# Patient Record
Sex: Female | Born: 1970 | Race: White | Hispanic: No | Marital: Married | Smoking: Never smoker
Health system: Southern US, Community
[De-identification: ages and names within clinical notes are randomized; demographics above are authoritative.]

---

## 2011-12-09 DIAGNOSIS — N870 Mild cervical dysplasia: Secondary | ICD-10-CM | POA: Insufficient documentation

## 2013-09-10 DIAGNOSIS — J381 Polyp of vocal cord and larynx: Secondary | ICD-10-CM | POA: Insufficient documentation

## 2013-09-10 DIAGNOSIS — I10 Essential (primary) hypertension: Secondary | ICD-10-CM | POA: Insufficient documentation

## 2013-09-10 DIAGNOSIS — R490 Dysphonia: Secondary | ICD-10-CM | POA: Insufficient documentation

## 2013-10-04 DIAGNOSIS — J381 Polyp of vocal cord and larynx: Secondary | ICD-10-CM | POA: Insufficient documentation

## 2013-12-21 DIAGNOSIS — K591 Functional diarrhea: Secondary | ICD-10-CM | POA: Insufficient documentation

## 2014-07-16 ENCOUNTER — Encounter: Payer: Self-pay | Admitting: Podiatry

## 2014-07-16 ENCOUNTER — Ambulatory Visit (INDEPENDENT_AMBULATORY_CARE_PROVIDER_SITE_OTHER): Payer: 59 | Admitting: Podiatry

## 2014-07-16 ENCOUNTER — Ambulatory Visit (INDEPENDENT_AMBULATORY_CARE_PROVIDER_SITE_OTHER): Payer: 59

## 2014-07-16 VITALS — Ht 69.0 in | Wt 185.0 lb

## 2014-07-16 DIAGNOSIS — M722 Plantar fascial fibromatosis: Secondary | ICD-10-CM

## 2014-07-16 MED ORDER — METHYLPREDNISOLONE 4 MG PO TBPK: ORAL_TABLET | ORAL | Status: DC

## 2014-07-16 MED ORDER — MELOXICAM 15 MG PO TABS: 15.0000 mg | ORAL_TABLET | Freq: Every day | ORAL | Status: DC

## 2014-07-16 NOTE — Progress Notes (Signed)
   Subjective:    Patient ID: Lauren PoloKatina Ringstad, female    DOB: Mar 26, 1970, 44 y.o.   MRN: [id number] Pt comes in today with right foot pain day and night, worse after sitting all periods of time and first thing in the am. Treatment: Pt has had cortzone pills 8-10 months but did not last.  Duration : this has been bothering her for about a year. Worse for about 3-4 months.   HPI    Review of Systems  All other systems reviewed and are negative.      Objective:   Physical Exam: I have reviewed her past mental history medications allergy surgery social history and review of systems. Pulses are strongly palpable bilateral. Neurologic sensorium is intact per Semmes-Weinstein monofilament. Deep tendon reflexes are intact bilateral and muscle strength +5 over 5 dorsiflexion plantar flexors and inverters and everters all intrinsic musculature is intact. Orthopedic evaluation demonstrates all joints distal to the ankle for range of motion I crepitation. He has pain on palpation medial calcaneal tubercle of the right heel. No pain on medial and lateral compression of the calcaneus. Radiographic evaluation confirms a plantar distally oriented calcaneal heel spur with a soft tissue increase in density at the plantar fascial calcaneal insertion site.        Assessment & Plan:  Assessment: Plantar fasciitis right heel.  Plan: Injected her right heel today with Kenalog and local anesthetic. Started her on a Medrol Dosepak to be followed by meloxicam. Dispensed the plantar fascial brace as well as a plantar fascial night splint. Discussed appropriate shoe gear stretching exercises ice therapy and sugar modifications also discussed the etiology pathology conservative versus surgical therapies. I will follow-up with her in 1 month. Should she have questions or concerns she will notify us immediately.

## 2014-08-11 ENCOUNTER — Encounter: Payer: Self-pay | Admitting: Podiatry

## 2014-08-11 ENCOUNTER — Ambulatory Visit (INDEPENDENT_AMBULATORY_CARE_PROVIDER_SITE_OTHER): Payer: 59 | Admitting: Podiatry

## 2014-08-11 VITALS — BP 116/64 | HR 74 | Resp 16

## 2014-08-11 DIAGNOSIS — M722 Plantar fascial fibromatosis: Secondary | ICD-10-CM

## 2014-08-11 NOTE — Progress Notes (Signed)
She presents today for follow-up of her plantar fasciitis right heel. She states that is approximately 75% improved. She continues conservative therapies.  Objective: Vital signs are stable she's alert and oriented 3. Pulses are strongly palpable. She is pain on palpation me for definitive of the right heel.  Assessment: Fasciitis 75% resolved  Plan: Reinjected the right heel today continue all other conservative therapies follow up with me in 1 month

## 2014-09-15 ENCOUNTER — Ambulatory Visit: Payer: Commercial Managed Care - PPO | Admitting: Podiatry

## 2014-12-10 ENCOUNTER — Ambulatory Visit (INDEPENDENT_AMBULATORY_CARE_PROVIDER_SITE_OTHER): Payer: 59 | Admitting: Podiatry

## 2014-12-10 ENCOUNTER — Encounter: Payer: Self-pay | Admitting: Podiatry

## 2014-12-10 VITALS — BP 117/75 | HR 83 | Resp 18

## 2014-12-10 DIAGNOSIS — M722 Plantar fascial fibromatosis: Secondary | ICD-10-CM

## 2014-12-10 MED ORDER — METHYLPREDNISOLONE 4 MG PO TBPK: ORAL_TABLET | ORAL | Status: DC

## 2014-12-10 NOTE — Progress Notes (Signed)
She presents today for follow-up of plantar fasciitis of her right foot. She states that she wears a brace which makes it feel better however she doesn't feel that the foot itself is getting any better. She states she is unable to wear her night splint and she was not able to take her medications because of it elevating her blood pressure.  Objective: Vital signs are stable she is alert and oriented 3. Pulses are palpable. She is pain on palpation medial calcaneal tubercle of the right heel.  Assessment: Plantar fasciitis right.  Plan: Reinjected the right heel today had her scan for some orthotics. Follow up with her once the cement.

## 2014-12-31 ENCOUNTER — Encounter: Payer: Self-pay | Admitting: Podiatry

## 2014-12-31 ENCOUNTER — Ambulatory Visit (INDEPENDENT_AMBULATORY_CARE_PROVIDER_SITE_OTHER): Payer: 59 | Admitting: Podiatry

## 2014-12-31 DIAGNOSIS — M722 Plantar fascial fibromatosis: Secondary | ICD-10-CM

## 2014-12-31 NOTE — Progress Notes (Signed)
She presents today to pick up her orthotics. She was given both oral and written home-going instructions for care and use of the orthotics she will follow-up with us in 4-6 weeks.

## 2014-12-31 NOTE — Patient Instructions (Signed)

## 2015-01-26 ENCOUNTER — Ambulatory Visit: Payer: 59 | Admitting: Podiatry

## 2015-02-02 ENCOUNTER — Ambulatory Visit: Payer: 59 | Admitting: Podiatry

## 2015-02-02 ENCOUNTER — Ambulatory Visit (INDEPENDENT_AMBULATORY_CARE_PROVIDER_SITE_OTHER): Payer: 59 | Admitting: Podiatry

## 2015-02-02 ENCOUNTER — Encounter: Payer: Self-pay | Admitting: Podiatry

## 2015-02-02 VITALS — BP 113/70 | HR 68 | Resp 12

## 2015-02-02 DIAGNOSIS — M722 Plantar fascial fibromatosis: Secondary | ICD-10-CM | POA: Diagnosis not present

## 2015-02-02 NOTE — Patient Instructions (Signed)
Pre-Operative Instructions  Congratulations, you have decided to take an important step to improving your quality of life.  You can be assured that the doctors of [facility] will be with you every step of the way.  1. Plan to be at the surgery center/hospital at least 1 (one) hour prior to your scheduled time unless otherwise directed by the surgical center/hospital staff.  You must have a responsible adult accompany you, remain during the surgery and drive you home.  Make sure you have directions to the surgical center/hospital and know how to get there on time. 2. For hospital based surgery you will need to obtain a history and physical form from your family physician within 1 month prior to the date of surgery- we will give you a form for you primary physician.  3. We make every effort to accommodate the date you request for surgery.  There are however, times where surgery dates or times have to be moved.  We will contact you as soon as possible if a change in schedule is required.   4. No Aspirin/Ibuprofen for one week before surgery.  If you are on aspirin, any non-steroidal anti-inflammatory medications (Mobic, Aleve, Ibuprofen) you should stop taking it 7 days prior to your surgery.  You make take Tylenol  For pain prior to surgery.  5. Medications- If you are taking daily heart and blood pressure medications, seizure, reflux, allergy, asthma, anxiety, pain or diabetes medications, make sure the surgery center/hospital is aware before the day of surgery so they may notify you which medications to take or avoid the day of surgery. 6. No food or drink after midnight the night before surgery unless directed otherwise by surgical center/hospital staff. 7. No alcoholic beverages 24 hours prior to surgery.  No smoking 24 hours prior to or 24 hours after surgery. 8. Wear loose pants or shorts- loose enough to fit over bandages, boots, and casts. 9. No slip on shoes, sneakers are best. 10. Bring  your boot with you to the surgery center/hospital.  Also bring crutches or a walker if your physician has prescribed it for you.  If you do not have this equipment, it will be provided for you after surgery. 11. If you have not been contracted by the surgery center/hospital by the day before your surgery, call to confirm the date and time of your surgery. 12. Leave-time from work may vary depending on the type of surgery you have.  Appropriate arrangements should be made prior to surgery with your employer. 13. Prescriptions will be provided immediately following surgery by your doctor.  Have these filled as soon as possible after surgery and take the medication as directed. 14. Remove nail polish on the operative foot. 15. Wash the night before surgery.  The night before surgery wash the foot and leg well with the antibacterial soap provided and water paying special attention to beneath the toenails and in between the toes.  Rinse thoroughly with water and dry well with a towel.  Perform this wash unless told not to do so by your physician.  Enclosed: 1 Ice pack (please put in freezer the night before surgery)   1 Hibiclens skin cleaner   Pre-op Instructions  If you have any questions regarding the instructions, do not hesitate to call our office.  [place]: [address] [place], [place] [address] [phone]  [place]: [address] [place], [place] [address] [phone]  [place]: [address]  [place], [place] [address] [phone]  Dr. Richard   Tuchman DPM, Dr. Norman Regal DPM Dr. Richard Sikora DPM, Dr. M. Todd Hyatt DPM, Dr. Kathryn Egerton DPM 

## 2015-02-02 NOTE — Progress Notes (Signed)
She presents today for follow-up of her plantar fasciitis right foot. She states that after the multiple injections, the orthotics, anti-inflammatories steroidal and nonsteroidal, braces and night splints with shoe gear changes my right foot is unchanged and it is still very painful limiting my daily activities.  Objective: Vital signs are stable she is alert and oriented 3. Pulses are strongly palpable. She has moderate to severe pain on palpation medial calcaneal tubercle of her right foot.  Assessment: Chronic intractable plantar fasciitis right.  Plan: Discussed etiology pathology conservative versus surgical therapy. We consented her today for an endoscopic plantar fasciotomy. We went over the consent form today line by line number by number off and her time to ask questions she saw fit regarding this procedure. I answered them to the best of my ability and layman's terms. We discussed the possible postop complications which may include but are not limited to postoperative pain bleeding swelling infection and recurrence need for further surgery loss of digit loss of limb loss of life. She signed all 3 pages of the consent form was dispensed a cam walker and I will follow-up with her before the end of the year for surgery.

## 2015-05-26 ENCOUNTER — Telehealth: Payer: Self-pay | Admitting: *Deleted

## 2015-05-26 NOTE — Telephone Encounter (Signed)
I'm calling to see if you would like to schedule surgery that Dr. Al CorpusHyatt mentioned to you.  "Yes, I've been waiting on someone to call me.  Can you give me some dates and I'll call you back?"  He can do it on March 24 or 31, April 7,21 or 28.  "Okay thank you, I'll call you back."

## 2016-07-18 ENCOUNTER — Ambulatory Visit: Payer: 59 | Admitting: Podiatry

## 2016-07-25 ENCOUNTER — Encounter: Payer: Self-pay | Admitting: Podiatry

## 2016-07-25 ENCOUNTER — Ambulatory Visit (INDEPENDENT_AMBULATORY_CARE_PROVIDER_SITE_OTHER): Payer: 59 | Admitting: Podiatry

## 2016-07-25 ENCOUNTER — Ambulatory Visit (INDEPENDENT_AMBULATORY_CARE_PROVIDER_SITE_OTHER): Payer: 59

## 2016-07-25 DIAGNOSIS — M79672 Pain in left foot: Secondary | ICD-10-CM | POA: Diagnosis not present

## 2016-07-25 DIAGNOSIS — M722 Plantar fascial fibromatosis: Secondary | ICD-10-CM

## 2016-07-25 DIAGNOSIS — M779 Enthesopathy, unspecified: Secondary | ICD-10-CM | POA: Diagnosis not present

## 2016-07-25 MED ORDER — MELOXICAM 15 MG PO TABS: 15.0000 mg | ORAL_TABLET | Freq: Every day | ORAL | 3 refills | Status: DC

## 2016-07-25 MED ORDER — METHYLPREDNISOLONE 4 MG PO TBPK: ORAL_TABLET | ORAL | 0 refills | Status: DC

## 2016-07-25 NOTE — Progress Notes (Signed)
She presents today chief complaint of pain to the medial aspect of her foot as the inside of this foot has been hurting now for the past few months she states been swelling and painful just a history of plantar fasciitis in 2016.  Objective: Vital signs are stable alert and oriented 3. Pulses are palpable. She is pain on palpation mucogingival of the left heel as well as some edema overlying the posterior tibial tendon which does appear to be fluctuant on palpation.  Assessment: Plantar fascitis left. Posterior tibial tendinitis left.  Plan: Injected the fashion today with Kenalog and local anesthetic injected the area of fluctuance of 6 months of local anesthetic started on a Medrol Dosepak to be followed by meloxicam. Posterior plantar fascia brace. She will use her night splint at home we discussed appropriate shoe gear stretching exercises and ice therapy. Follow up with her in 1 month.

## 2016-09-19 ENCOUNTER — Ambulatory Visit (INDEPENDENT_AMBULATORY_CARE_PROVIDER_SITE_OTHER): Payer: BLUE CROSS/BLUE SHIELD | Admitting: Podiatry

## 2016-09-19 ENCOUNTER — Encounter: Payer: Self-pay | Admitting: Podiatry

## 2016-09-19 DIAGNOSIS — M722 Plantar fascial fibromatosis: Secondary | ICD-10-CM

## 2016-09-19 DIAGNOSIS — M76822 Posterior tibial tendinitis, left leg: Secondary | ICD-10-CM

## 2016-09-19 NOTE — Progress Notes (Signed)
She presents today for follow-up of her plantar fasciitis and posterior tibial tendinitis of her left foot. She states that really hasn't gone away the shot helped some but it came right back.  Objective: Vital signs are stable she is alert and oriented 3. Pulses are palpable. She has severe pain on palpation medial calcaneal tubercle of the left heel as well as the insertion site of the posterior tibial tendon on the navicular tuberosity. She has fluctuance within the tendon sheath as well. This is consistent with tenosynovitis or tendinitis. Cannot rule out tear secondary to the chronic pain.  Assessment: Pain in limb secondary plantar fasciitis and tendinitis.  Plan: MRI requesting for surgical intervention.

## 2016-09-22 ENCOUNTER — Telehealth: Payer: Self-pay

## 2016-09-22 NOTE — Addendum Note (Signed)
Addended by: Geraldine ContrasVENABLE, Hernando Reali D on: 09/22/2016 11:30 AM   Modules accepted: Orders

## 2016-09-22 NOTE — Telephone Encounter (Signed)
Spoke with Aram Beechamynthia with [employer], per Aram Beechamynthia no prior authorization is required for MRI rt heel.   Order has been sent to MRI schedulers.

## 2016-10-11 ENCOUNTER — Encounter: Payer: Self-pay | Admitting: Radiology

## 2016-10-11 ENCOUNTER — Ambulatory Visit
Admission: RE | Admit: 2016-10-11 | Discharge: 2016-10-11 | Disposition: A | Discharge status: Home / Self Care | Payer: BLUE CROSS/BLUE SHIELD | Source: Ambulatory Visit | Attending: Podiatry | Admitting: Podiatry

## 2016-10-11 DIAGNOSIS — M25472 Effusion, left ankle: Secondary | ICD-10-CM | POA: Insufficient documentation

## 2016-10-11 DIAGNOSIS — X58XXXA Exposure to other specified factors, initial encounter: Secondary | ICD-10-CM | POA: Diagnosis not present

## 2016-10-11 DIAGNOSIS — M722 Plantar fascial fibromatosis: Secondary | ICD-10-CM | POA: Diagnosis present

## 2016-10-11 DIAGNOSIS — M76822 Posterior tibial tendinitis, left leg: Secondary | ICD-10-CM | POA: Diagnosis present

## 2016-10-11 DIAGNOSIS — S86012A Strain of left Achilles tendon, initial encounter: Secondary | ICD-10-CM | POA: Insufficient documentation

## 2016-10-12 ENCOUNTER — Ambulatory Visit (INDEPENDENT_AMBULATORY_CARE_PROVIDER_SITE_OTHER): Payer: BLUE CROSS/BLUE SHIELD | Admitting: Podiatry

## 2016-10-12 ENCOUNTER — Encounter: Payer: Self-pay | Admitting: Podiatry

## 2016-10-12 DIAGNOSIS — M722 Plantar fascial fibromatosis: Secondary | ICD-10-CM

## 2016-10-12 NOTE — Progress Notes (Signed)
She presents today for follow-up of her MRI and severe plantar fasciitis of her left foot.  Objective: Vital signs are stable she is alert and oriented 3. Pulses are palpable. Neurologic sensorium is intact. She has severe pain on palpation medially continued on the left heel and MRI does demonstrate severe plantar fasciitis and edema in the bone.  Assessment: Severe plantar fasciitis left foot.  Plan: Consented her today for an instep plantar fasciotomy and injected the left heel. We discussed pros and cons of the fasciotomy she understands and is amenable to a signed of revision sent for she will be out of work for at least 2 weeks. We dispensed a cam walker as well as paperwork for the surgery center and anesthesia group. She also received preop paperwork from us as well.

## 2016-10-12 NOTE — Patient Instructions (Signed)
Pre-Operative Instructions  Congratulations, you have decided to take an important step to improving your quality of life.  You can be assured that the doctors of [facility] will be with you every step of the way.  1. Plan to be at the surgery center/hospital at least 1 (one) hour prior to your scheduled time unless otherwise directed by the surgical center/hospital staff.  You must have a responsible adult accompany you, remain during the surgery and drive you home.  Make sure you have directions to the surgical center/hospital and know how to get there on time. 2. For hospital based surgery you will need to obtain a history and physical form from your family physician within 1 month prior to the date of surgery- we will give you a form for you primary physician.  3. We make every effort to accommodate the date you request for surgery.  There are however, times where surgery dates or times have to be moved.  We will contact you as soon as possible if a change in schedule is required.   4. No Aspirin/Ibuprofen for one week before surgery.  If you are on aspirin, any non-steroidal anti-inflammatory medications (Mobic, Aleve, Ibuprofen) you should stop taking it 7 days prior to your surgery.  You make take Tylenol  For pain prior to surgery.  5. Medications- If you are taking daily heart and blood pressure medications, seizure, reflux, allergy, asthma, anxiety, pain or diabetes medications, make sure the surgery center/hospital is aware before the day of surgery so they may notify you which medications to take or avoid the day of surgery. 6. No food or drink after midnight the night before surgery unless directed otherwise by surgical center/hospital staff. 7. No alcoholic beverages 24 hours prior to surgery.  No smoking 24 hours prior to or 24 hours after surgery. 8. Wear loose pants or shorts- loose enough to fit over bandages, boots, and casts. 9. No slip on shoes, sneakers are best. 10. Bring  your boot with you to the surgery center/hospital.  Also bring crutches or a walker if your physician has prescribed it for you.  If you do not have this equipment, it will be provided for you after surgery. 11. If you have not been contracted by the surgery center/hospital by the day before your surgery, call to confirm the date and time of your surgery. 12. Leave-time from work may vary depending on the type of surgery you have.  Appropriate arrangements should be made prior to surgery with your employer. 13. Prescriptions will be provided immediately following surgery by your doctor.  Have these filled as soon as possible after surgery and take the medication as directed. 14. Remove nail polish on the operative foot. 15. Wash the night before surgery.  The night before surgery wash the foot and leg well with the antibacterial soap provided and water paying special attention to beneath the toenails and in between the toes.  Rinse thoroughly with water and dry well with a towel.  Perform this wash unless told not to do so by your physician.  Enclosed: 1 Ice pack (please put in freezer the night before surgery)   1 Hibiclens skin cleaner   Pre-op Instructions  If you have any questions regarding the instructions, do not hesitate to call our office.  [place]: [address] [place], [place] [address] [phone]  [place]: [address] [place], [place] [address] [phone]  [place]: [address]  [place], [place] [address] [phone]   Dr.   Norman Regal DPM, Dr. Matthew Wagoner DPM, Dr. M. Todd Hyatt DPM, Dr. Titorya Stover DPM 

## 2016-10-13 ENCOUNTER — Telehealth: Payer: Self-pay | Admitting: *Deleted

## 2016-10-13 NOTE — Telephone Encounter (Signed)
"  I'm a patient of Dr. Al CorpusHyatt.  He told me to call you to schedule my surgery."  Do you have a date in mind?  "I'd like to do it sooner but I can't.  Does he have anything on September 14?"  Yes, that date is available.  I will get it scheduled.   You can register now with the surgical center, instructions are in you brochure that was given to you.  "What times are available?"  Someone from the surgical center will call you with the arrival time a day or two prior to surgery date.

## 2016-10-18 DIAGNOSIS — M722 Plantar fascial fibromatosis: Secondary | ICD-10-CM

## 2016-11-09 ENCOUNTER — Ambulatory Visit: Payer: BLUE CROSS/BLUE SHIELD | Admitting: Podiatry

## 2016-11-17 ENCOUNTER — Telehealth: Payer: Self-pay | Admitting: Podiatry

## 2016-11-17 NOTE — Telephone Encounter (Signed)
Patient is having surgery Friday Sept 14 and she has requested an order for a knee scooter. Would like to have the knee scooter at home when she gets home so that she is not weight bearing.  Patient would [facility] to [facility] the [facility] asap.

## 2016-11-17 NOTE — Telephone Encounter (Signed)
I returned patient call and informed her that she order from [facility] for cheaper or pick up rx.  She requested an rx to file with insurance.   She will come to office to pick up rx, which has been left at front desk

## 2016-11-17 NOTE — Telephone Encounter (Signed)
Please write a script for knee scooter.  Tell her it would be less expensive if she ordered it from [facility]

## 2016-11-29 ENCOUNTER — Telehealth: Payer: Self-pay | Admitting: *Deleted

## 2016-11-29 NOTE — Telephone Encounter (Signed)
"  If you could please give me a call back in regards to rescheduling a surgery coming up this Friday.  Thank you."

## 2016-11-30 NOTE — Telephone Encounter (Signed)
"  I'm calling to reschedule my surgery that I had scheduled for this Friday."  Do you have a date in mind that you want?  "Does he have anything next Friday?"  Yes, he can do it next Friday.  I'll get it scheduled.  "What time will I need to be there?"  Someone from the surgical center will call you with the arrival time a day or two prior to surgery date.  "They sent an email before."  They may do it this time as well.  I called Aram [facility]ynthia at [facility] and scheduled the surgery for 12/09/2016.

## 2016-12-07 ENCOUNTER — Encounter: Payer: BLUE CROSS/BLUE SHIELD | Admitting: Podiatry

## 2016-12-08 ENCOUNTER — Other Ambulatory Visit: Payer: Self-pay | Admitting: Podiatry

## 2016-12-08 MED ORDER — PROMETHAZINE HCL 25 MG PO TABS: 25.0000 mg | ORAL_TABLET | Freq: Three times a day (TID) | ORAL | 0 refills | Status: DC | PRN

## 2016-12-08 MED ORDER — CLINDAMYCIN HCL 150 MG PO CAPS: 150.0000 mg | ORAL_CAPSULE | Freq: Three times a day (TID) | ORAL | 0 refills | Status: DC

## 2016-12-08 MED ORDER — OXYCODONE-ACETAMINOPHEN 10-325 MG PO TABS: 1.0000 | ORAL_TABLET | ORAL | 0 refills | Status: DC | PRN

## 2016-12-09 ENCOUNTER — Encounter: Payer: Self-pay | Admitting: Podiatry

## 2016-12-09 DIAGNOSIS — M722 Plantar fascial fibromatosis: Secondary | ICD-10-CM | POA: Diagnosis not present

## 2016-12-16 ENCOUNTER — Ambulatory Visit (INDEPENDENT_AMBULATORY_CARE_PROVIDER_SITE_OTHER): Payer: BLUE CROSS/BLUE SHIELD | Admitting: Podiatry

## 2016-12-16 ENCOUNTER — Encounter: Payer: BLUE CROSS/BLUE SHIELD | Admitting: Podiatry

## 2016-12-16 DIAGNOSIS — Z9889 Other specified postprocedural states: Secondary | ICD-10-CM

## 2016-12-16 NOTE — Progress Notes (Signed)
   Subjective:  Patient presents today status post endoscopic plantar fasciotomy left foot. DOS: 12/09/2016. Patient states that she's doing very well. Patient has no new complaints at this time. There is some white discoloration on the plantar aspect of her foot from maceration of the skin.   No past medical history on file.    Objective/Physical Exam Skin incisions appear to be well coapted with sutures and staples intact. No sign of infectious process noted. No dehiscence. No active bleeding noted. Moderate edema noted to the surgical extremity. Neurovascular status intact There is some maceration noted the plantar aspect of the forefoot. Negative for skin breakdown.  Radiographic Exam:  Orthopedic hardware and osteotomies sites appear to be stable with routine healing.  Assessment: 1. s/p endoscopic plantar fasciotomy left. DOS: 12/09/2016   Plan of Care:  1. Patient was evaluated.  2. Today dressings were changed. Recommend the patient apply antibiotic ointment and Band-Aid daily. 3. Compression anklet was dispensed 4. Return to clinic in 1 week for possible suture removal   Felecia Shelling, DPM [facility]  Dr. Felecia Shelling, DPM    [address]                                        [place], [place] [address]                Office [phone]  Fax [phone]

## 2016-12-21 ENCOUNTER — Encounter: Payer: BLUE CROSS/BLUE SHIELD | Admitting: Podiatry

## 2016-12-26 ENCOUNTER — Encounter: Payer: Self-pay | Admitting: Podiatry

## 2016-12-26 ENCOUNTER — Ambulatory Visit: Payer: BLUE CROSS/BLUE SHIELD | Admitting: Podiatry

## 2016-12-26 ENCOUNTER — Ambulatory Visit (INDEPENDENT_AMBULATORY_CARE_PROVIDER_SITE_OTHER): Payer: BLUE CROSS/BLUE SHIELD | Admitting: Podiatry

## 2016-12-26 DIAGNOSIS — M722 Plantar fascial fibromatosis: Secondary | ICD-10-CM

## 2016-12-27 NOTE — Progress Notes (Signed)
DOS 12/09/16 EPF Lt

## 2016-12-27 NOTE — Progress Notes (Signed)
She presents today 2 weeks status post endoscopic plantar fasciotomy left foot. She states that it feels like it's 100% better now ready to go She denies fever chills nausea vomiting muscle aches or pains. States that she is ready to get out of her boot.  Objective: Vital signs are stable she is alert and oriented 3 sutures were removed today margins remain well coapted no erythema cellulitis drainage or odor. No pain on palpation medial calcaneal tubercle.  Assessment: Well-healing surgical foot left.  Plan: I recommended today that she stay off his foot as much as she can I will allow her to start wearing tennis shoes but she still needs to treated as a surgical foot. I told her not to be over anxious about getting back to her exercising. She is to continue to wear either her large boot or her night splint at bedtime. We dispensed night splint.

## 2017-01-23 ENCOUNTER — Ambulatory Visit (INDEPENDENT_AMBULATORY_CARE_PROVIDER_SITE_OTHER): Payer: BLUE CROSS/BLUE SHIELD | Admitting: Podiatry

## 2017-01-23 ENCOUNTER — Encounter: Payer: Self-pay | Admitting: Podiatry

## 2017-01-23 DIAGNOSIS — Z9889 Other specified postprocedural states: Secondary | ICD-10-CM

## 2017-01-23 DIAGNOSIS — M722 Plantar fascial fibromatosis: Secondary | ICD-10-CM

## 2017-01-23 NOTE — Progress Notes (Signed)
She is status post EPF left foot date of surgery 12/09/2016. She states that is doing just great. Has no pain.  Objective: No pain on palpation medially into the left foot. No erythema edema cellulitis drainage or odor. Wounds are gone to heal uneventfully.  Assessment: 90-100% resolved pain on her fascial left.  Plan: Follow-up with a 1 month if necessary

## 2017-02-20 ENCOUNTER — Ambulatory Visit (INDEPENDENT_AMBULATORY_CARE_PROVIDER_SITE_OTHER): Payer: BLUE CROSS/BLUE SHIELD | Admitting: Podiatry

## 2017-02-20 ENCOUNTER — Encounter: Payer: Self-pay | Admitting: Podiatry

## 2017-02-20 DIAGNOSIS — M722 Plantar fascial fibromatosis: Secondary | ICD-10-CM

## 2017-02-20 NOTE — Progress Notes (Signed)
She presents today for follow-up of her endoscopic plantar fasciotomy left foot. She states that she still has some soreness but it is much better than it was. She states that she has no plantar fascial pain at all.  Objective: Vital signs are stable alert and oriented 3. Pulses are palpable. There is no erythema edema cellulitis drainage or odor. She still has some scar tissue that is palpable to the plantar aspect of the foot for the trocar was introduced. This should resolve.  Assessment: Endoscopic plantar fasciotomy healing well.  Plan: Follow up with me on an as-needed basis encouraged massage therapy to help break down scar tissue.

## 2019-04-30 IMAGING — MR MR HEEL *L* W/O CM
5 series · 40 of 40 positions shown · non-contrast
Comparison: None.

CLINICAL DATA: Plantar fasciitis for 2 years. Left heel pain. Not
responding to treatment.

EXAM:
MR OF THE LEFT HEEL WITHOUT CONTRAST
TECHNIQUE: Multiplanar, multisequence MR imaging of the ankle was performed. No
intravenous contrast was administered.

[Series 3: PD fat-sat · axial · 3.0mm · 0.66mm/px · z∈[-39,+83]mm · 8 of 38 slices shown]
[im 1/38]
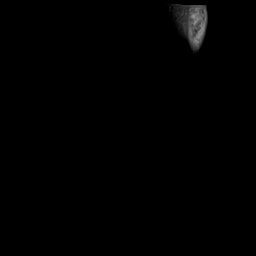
[im 6/38]
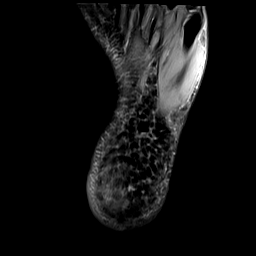
[im 11/38]
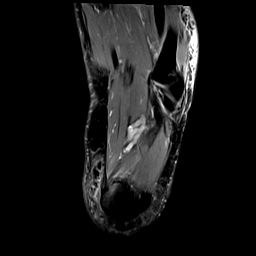
[im 16/38]
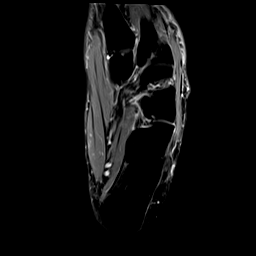
[im 22/38]
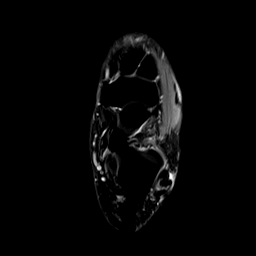
[im 27/38]
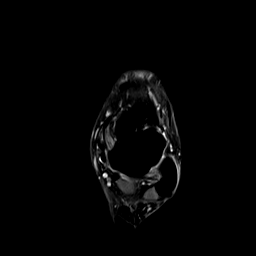
[im 32/38]
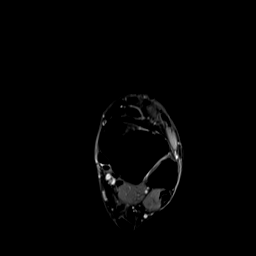
[im 38/38]
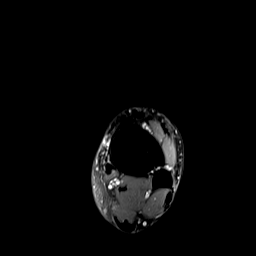

[Series 4: T2 fat-sat · axial · 3.0mm · 0.66mm/px · z∈[-39,+83]mm · 9 of 38 slices shown (1 of 3)]
[im 1/38]
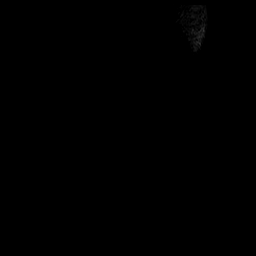
[im 5/38]
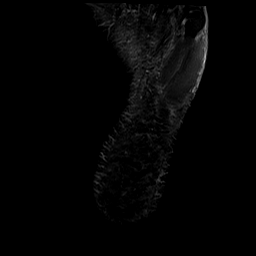
[im 10/38]
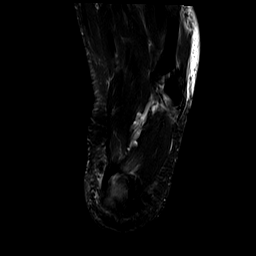
[im 14/38]
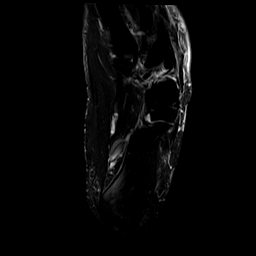
[im 19/38]
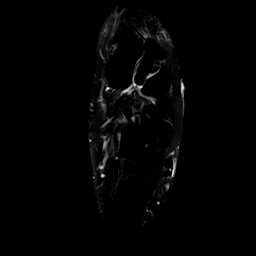
[im 24/38]
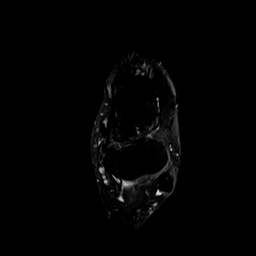
[im 28/38]
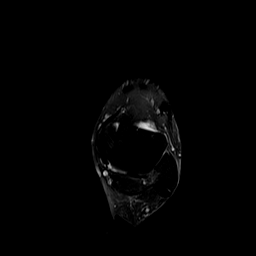
[im 33/38]
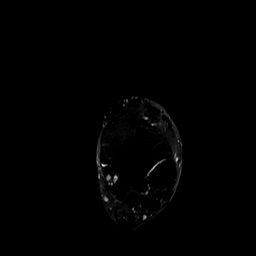
[im 38/38]
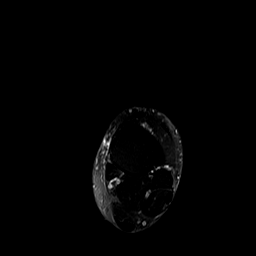

[Series 5: T2 fat-sat · coronal · 3.0mm · 0.70mm/px · 11 of 49 slices shown (2 of 3)]
[im 1/49]
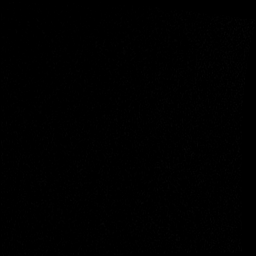
[im 5/49]
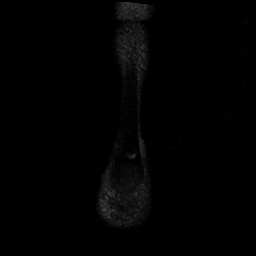
[im 10/49]
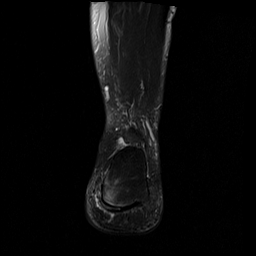
[im 15/49]
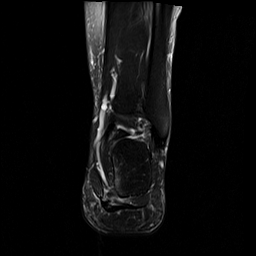
[im 20/49]
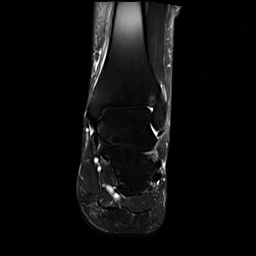
[im 25/49]
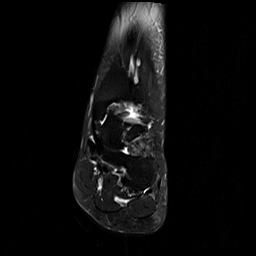
[im 29/49]
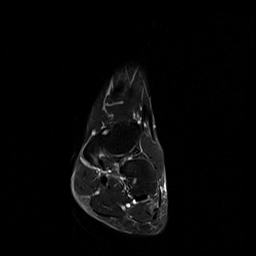
[im 34/49]
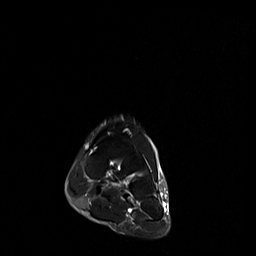
[im 39/49]
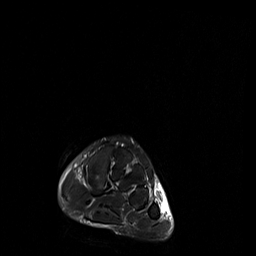
[im 44/49]
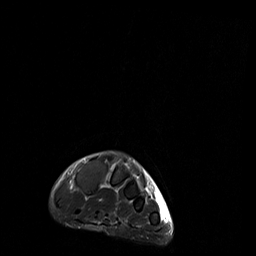
[im 49/49]
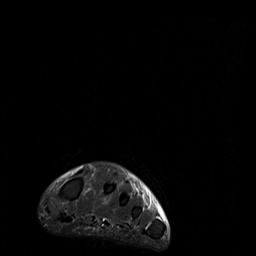

[Series 6: T1 · sagittal · 3.0mm · 0.56mm/px · 6 of 26 slices shown]
[im 1/26]
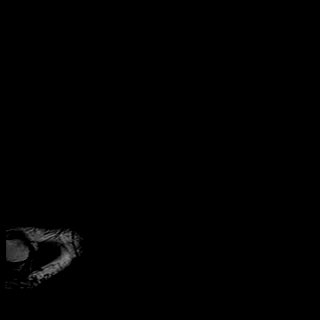
[im 6/26]
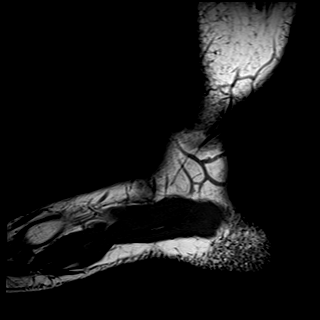
[im 11/26]
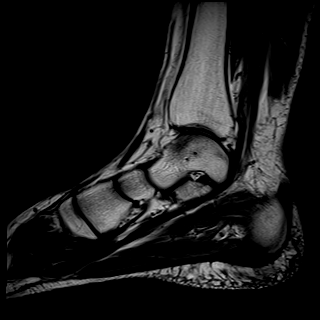
[im 16/26]
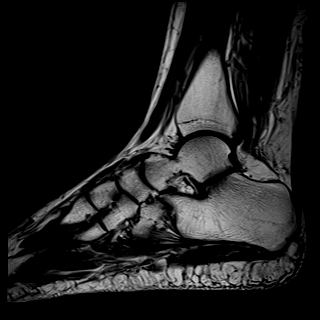
[im 21/26]
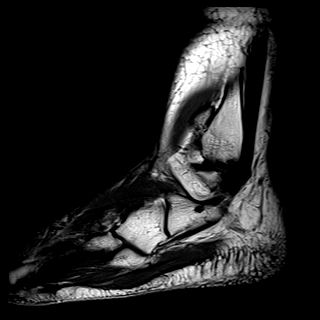
[im 26/26]
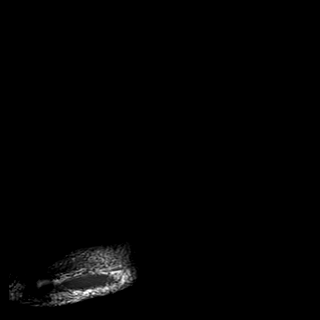

[Series 7: T2 fat-sat · sagittal · 3.0mm · 0.70mm/px · 6 of 26 slices shown (3 of 3)]
[im 1/26]
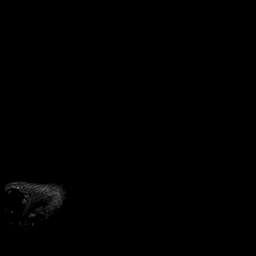
[im 6/26]
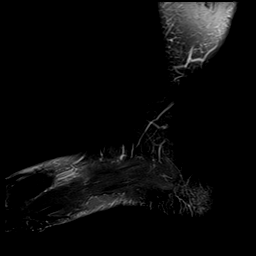
[im 11/26]
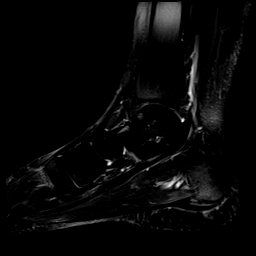
[im 16/26]
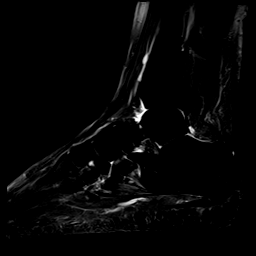
[im 21/26]
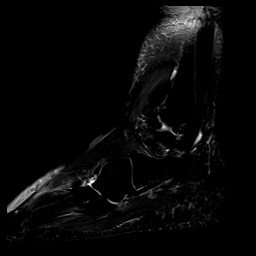
[im 26/26]
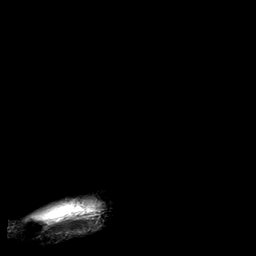

[40 of 40 positions shown; findings below may reference images not displayed]

FINDINGS: TENDONS

Peroneal: Peroneal longus tendon intact. Peroneal brevis intact.

Posteromedial: Posterior tibial tendon intact. Flexor hallucis
longus tendon intact. Flexor digitorum longus tendon intact.

Anterior: Tibialis anterior tendon intact. Extensor hallucis longus
tendon intact Extensor digitorum longus tendon intact.

Achilles:  Intact.

Plantar Fascia: Thickening of the medial band of the plantar fascia
adjacent to the calcaneal insertion with mild perifascial edema and
a partial-thickness tear at the calcaneal insertion. Severe
subcortical reactive marrow edema at the calcaneal insertion.

LIGAMENTS

Lateral: Anterior talofibular ligament intact. Calcaneofibular
ligament intact. Posterior talofibular ligament intact. Anterior and
posterior tibiofibular ligaments intact.

Medial: Deltoid ligament intact. Spring ligament intact.

CARTILAGE

Ankle Joint: No joint effusion. Normal ankle mortise. No chondral
defect.

Subtalar Joints/Sinus Tarsi: Normal subtalar joints. No subtalar
joint effusion. Normal sinus tarsi.

Bones: No acute fracture or dislocation. Mild reactive marrow edema
in the medial and lateral hallux sesamoids and adjacent metatarsal
head consistent with degenerative changes.

Soft Tissue: No fluid collection or hematoma.
IMPRESSION: 1. Severe plantar fasciitis of the medial band of the plantar fascia
at the calcaneal insertion with a partial-thickness tear and severe
subcortical marrow edema.
